# Patient Record
Sex: Female | Born: 1990 | Race: Black or African American | Hispanic: No | Marital: Single | Smoking: Former smoker
Health system: Southern US, Community
[De-identification: ages and names within clinical notes are randomized; demographics above are authoritative.]

## PROBLEM LIST (undated history)

## (undated) DIAGNOSIS — J45909 Unspecified asthma, uncomplicated: Secondary | ICD-10-CM

## (undated) DIAGNOSIS — F53 Postpartum depression: Secondary | ICD-10-CM

## (undated) DIAGNOSIS — O99345 Other mental disorders complicating the puerperium: Secondary | ICD-10-CM

## (undated) HISTORY — DX: Postpartum depression: F53.0

## (undated) HISTORY — PX: APPENDECTOMY: SHX54

## (undated) HISTORY — DX: Other mental disorders complicating the puerperium: O99.345

## (undated) HISTORY — PX: CHOLECYSTECTOMY: SHX55

---

## 2017-01-02 ENCOUNTER — Emergency Department (HOSPITAL_BASED_OUTPATIENT_CLINIC_OR_DEPARTMENT_OTHER)
Admission: EM | Admit: 2017-01-02 | Discharge: 2017-01-02 | Disposition: A | Discharge status: Home / Self Care | Payer: BLUE CROSS/BLUE SHIELD | Attending: Physician Assistant | Admitting: Physician Assistant

## 2017-01-02 ENCOUNTER — Encounter (HOSPITAL_BASED_OUTPATIENT_CLINIC_OR_DEPARTMENT_OTHER): Payer: Self-pay

## 2017-01-02 ENCOUNTER — Emergency Department (HOSPITAL_BASED_OUTPATIENT_CLINIC_OR_DEPARTMENT_OTHER): Payer: BLUE CROSS/BLUE SHIELD

## 2017-01-02 DIAGNOSIS — R079 Chest pain, unspecified: Secondary | ICD-10-CM | POA: Diagnosis present

## 2017-01-02 DIAGNOSIS — J45909 Unspecified asthma, uncomplicated: Secondary | ICD-10-CM | POA: Insufficient documentation

## 2017-01-02 DIAGNOSIS — B349 Viral infection, unspecified: Secondary | ICD-10-CM

## 2017-01-02 DIAGNOSIS — F17200 Nicotine dependence, unspecified, uncomplicated: Secondary | ICD-10-CM | POA: Insufficient documentation

## 2017-01-02 HISTORY — DX: Unspecified asthma, uncomplicated: J45.909

## 2017-01-02 LAB — URINALYSIS, MICROSCOPIC (REFLEX)

## 2017-01-02 LAB — COMPREHENSIVE METABOLIC PANEL
ALBUMIN: 3.9 g/dL (ref 3.5–5.0)
ALT: 12 U/L — ABNORMAL LOW (ref 14–54)
ANION GAP: 7 (ref 5–15)
AST: 15 U/L (ref 15–41)
Alkaline Phosphatase: 68 U/L (ref 38–126)
BILIRUBIN TOTAL: 0.3 mg/dL (ref 0.3–1.2)
BUN: 12 mg/dL (ref 6–20)
CHLORIDE: 105 mmol/L (ref 101–111)
CO2: 23 mmol/L (ref 22–32)
Calcium: 9 mg/dL (ref 8.9–10.3)
Creatinine, Ser: 0.66 mg/dL (ref 0.44–1.00)
GFR calc Af Amer: >60 mL/min (ref >60)
Glucose, Bld: 94 mg/dL (ref 65–99)
POTASSIUM: 4 mmol/L (ref 3.5–5.1)
Sodium: 135 mmol/L (ref 135–145)
Total Protein: 7.3 g/dL (ref 6.5–8.1)

## 2017-01-02 LAB — CBC WITH DIFFERENTIAL/PLATELET
BASOS ABS: 0 10*3/uL (ref 0.0–0.1)
Basophils Relative: 0 %
Eosinophils Absolute: 0.2 10*3/uL (ref 0.0–0.7)
Eosinophils Relative: 2 %
HEMATOCRIT: 35.3 % — AB (ref 36.0–46.0)
Hemoglobin: 11.8 g/dL — ABNORMAL LOW (ref 12.0–15.0)
LYMPHS ABS: 1.2 10*3/uL (ref 0.7–4.0)
LYMPHS PCT: 14 %
MCH: 26.6 pg (ref 26.0–34.0)
MCHC: 33.4 g/dL (ref 30.0–36.0)
MCV: 79.7 fL (ref 78.0–100.0)
Monocytes Absolute: 0.8 10*3/uL (ref 0.1–1.0)
Monocytes Relative: 9 %
NEUTROS ABS: 6.7 10*3/uL (ref 1.7–7.7)
Neutrophils Relative %: 75 %
Platelets: 194 10*3/uL (ref 150–400)
RBC: 4.43 MIL/uL (ref 3.87–5.11)
RDW: 15.4 % (ref 11.5–15.5)
WBC: 8.8 10*3/uL (ref 4.0–10.5)

## 2017-01-02 LAB — URINALYSIS, ROUTINE W REFLEX MICROSCOPIC
Bilirubin Urine: NEGATIVE
GLUCOSE, UA: NEGATIVE mg/dL
HGB URINE DIPSTICK: NEGATIVE
Ketones, ur: NEGATIVE mg/dL
Nitrite: NEGATIVE
PROTEIN: NEGATIVE mg/dL
SPECIFIC GRAVITY, URINE: 1.025 (ref 1.005–1.030)
pH: 5.5 (ref 5.0–8.0)

## 2017-01-02 LAB — PREGNANCY, URINE: Preg Test, Ur: NEGATIVE

## 2017-01-02 LAB — TROPONIN I: Troponin I: <0.03 ng/mL (ref <0.03)

## 2017-01-02 MED ORDER — MONTELUKAST SODIUM 10 MG PO TABS: 10.0000 mg | ORAL_TABLET | Freq: Every day | ORAL | 0 refills | Status: AC

## 2017-01-02 MED ORDER — SODIUM CHLORIDE 0.9 % IV BOLUS (SEPSIS)
1000.0000 mL | Freq: Once | INTRAVENOUS | Status: AC
  Administered 2017-01-02: 1000 mL via INTRAVENOUS

## 2017-01-02 MED ORDER — FLUTICASONE PROPIONATE 50 MCG/ACT NA SUSP: 2.0000 | Freq: Every day | NASAL | 0 refills | Status: AC
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
## 2017-01-02 NOTE — ED Triage Notes (Signed)
c/o CP x 1 week-NAD-steady gait-NAD

## 2017-01-24 ENCOUNTER — Telehealth: Payer: Self-pay | Admitting: Behavioral Health

## 2017-01-24 NOTE — Telephone Encounter (Signed)
Attempted to reach patient for Pre-Visit Call. Unable to leave voicemail; per recording, the mailbox is full.

## 2017-01-27 ENCOUNTER — Other Ambulatory Visit (HOSPITAL_COMMUNITY)
Admission: RE | Admit: 2017-01-27 | Discharge: 2017-01-27 | Disposition: A | Discharge status: Home / Self Care | Payer: BLUE CROSS/BLUE SHIELD | Source: Ambulatory Visit | Attending: Family | Admitting: Family

## 2017-01-27 ENCOUNTER — Ambulatory Visit (INDEPENDENT_AMBULATORY_CARE_PROVIDER_SITE_OTHER): Payer: BLUE CROSS/BLUE SHIELD | Admitting: Family

## 2017-01-27 ENCOUNTER — Encounter: Payer: Self-pay | Admitting: Family

## 2017-01-27 VITALS — BP 103/62 | HR 70 | Temp 98.3°F | Resp 16 | Ht 68.0 in | Wt 203.2 lb

## 2017-01-27 DIAGNOSIS — N760 Acute vaginitis: Secondary | ICD-10-CM | POA: Diagnosis not present

## 2017-01-27 DIAGNOSIS — B373 Candidiasis of vulva and vagina: Secondary | ICD-10-CM | POA: Diagnosis not present

## 2017-01-27 DIAGNOSIS — R102 Pelvic and perineal pain unspecified side: Secondary | ICD-10-CM

## 2017-01-27 DIAGNOSIS — J45909 Unspecified asthma, uncomplicated: Secondary | ICD-10-CM

## 2017-01-27 DIAGNOSIS — J069 Acute upper respiratory infection, unspecified: Secondary | ICD-10-CM | POA: Diagnosis not present

## 2017-01-27 DIAGNOSIS — Z23 Encounter for immunization: Secondary | ICD-10-CM

## 2017-01-27 DIAGNOSIS — B9689 Other specified bacterial agents as the cause of diseases classified elsewhere: Secondary | ICD-10-CM | POA: Diagnosis not present

## 2017-01-27 DIAGNOSIS — N898 Other specified noninflammatory disorders of vagina: Secondary | ICD-10-CM

## 2017-01-27 MED ORDER — ALBUTEROL SULFATE HFA 108 (90 BASE) MCG/ACT IN AERS: 2.0000 | INHALATION_SPRAY | Freq: Four times a day (QID) | RESPIRATORY_TRACT | 0 refills | Status: AC | PRN

## 2017-01-27 NOTE — Addendum Note (Signed)
Addended by: Mervin KungFERGERSON, Petro Talent A on: 01/27/2017 04:12 PM   Modules accepted: Orders

## 2017-01-27 NOTE — Patient Instructions (Addendum)
We will contact you with your results and further recommendations. For your congestion you may use mucinex as needed. For asthma symptoms you may use albuterol as needed. You will be contacted about scheduling your pelvic ultrasound.  Call if cold symptoms worsen or if they fail to improve or if pelvic pain worsens.  Welcome to [facility]!

## 2017-01-27 NOTE — Progress Notes (Signed)
Subjective:    Patient ID: Meghan Harvey, female    DOB: Mar 30, 1991, 26 y.o.   MRN: [id number]  HPI  Meghan Harvey is a 26 yr old female who presents today to establish care.  She has two concerns:  1) "chest cold"- symptom have been present x 2 days. Reports + hx of asthma.She reports that she has had asthma since she was a child.  Uses her son's inhaler prn.  Notes thick sputum (green/yellow x 2 days).  She has an rx for singulair and flonase.  Has not started.    2) Pelvic pain- intermittent. Has been present x 2 months.  Has mild L lower pelvic pain.  A few days prior she had right sided pain.  Notes + vaginal discharge.  Discharge is thick/white.  Reports a new sexual partner but used condom.    Of note, she was recently seen in the ED on 5/10 and was diagnosed with viral illness. (ER record is reviewed). She was noted to be mildly anemic at that time (microcytic).  Urine HCG was negative. Denies dysuria/fever.  Did have one mucoid stool the other day.  Reports last pap 1-2 years.   Review of Systems  Constitutional: Negative for unexpected weight change.  HENT: Positive for rhinorrhea. Negative for hearing loss.   Eyes: Negative for visual disturbance.  Respiratory: Positive for cough.   Cardiovascular: Negative for leg swelling.  Gastrointestinal: Negative for constipation and diarrhea.  Genitourinary: Positive for pelvic pain and vaginal discharge. Negative for dysuria and menstrual problem.       Dyspareunia- for years   Musculoskeletal: Negative for arthralgias and myalgias.  Skin: Negative for rash.  Neurological:       Occasional headaches  Hematological: Negative for adenopathy.  Psychiatric/Behavioral:       Notes low energy but feels like this is related to working too much   Past Medical History:  Diagnosis Date  . Asthma      Social History   Social History  . Marital status: Single    Spouse name: N/A  . Number of children: N/A  . Years of education: N/A    Occupational History  . Not on file.   Social History Main Topics  . Smoking status: Former Smoker    Quit date: 03/26/2016  . Smokeless tobacco: Never Used  . Alcohol use Yes     Comment: wine coolers socially  . Drug use: No  . Sexual activity: Yes    Birth control/ protection: None   Other Topics Concern  . Not on file   Social History Narrative  . No narrative on file    Past Surgical History:  Procedure Laterality Date  . APPENDECTOMY    . CHOLECYSTECTOMY      Family History  Problem Relation Age of Onset  . Hypertension Mother   . Heart failure Mother   . Diabetes Mother   . Cervical cancer Mother   . Sickle cell trait Father   . Cancer Maternal Grandmother        breast  . Alzheimer's disease Paternal Grandmother   . Sickle cell trait Other     Allergies  Allergen Reactions  . Penicillins   . Shellfish Allergy     Current Outpatient Prescriptions on File Prior to Visit  Medication Sig Dispense Refill  . fluticasone (FLONASE) 50 MCG/ACT nasal spray Place 2 sprays into both nostrils daily. (Patient not taking: Reported on 01/27/2017) 9.9 g 0  . montelukast (SINGULAIR) 10  MG tablet Take 1 tablet (10 mg total) by mouth at bedtime. (Patient not taking: Reported on 01/27/2017) 30 tablet 0   No current facility-administered medications on file prior to visit.     BP 103/62 (BP Location: Right Arm, Cuff Size: Large)   Pulse 70   Temp 98.3 F (36.8 C) (Oral)   Resp 16   Ht 5\' 8"  (1.727 m)   Wt 203 lb 3.2 oz (92.2 kg)   LMP 01/04/2017   SpO2 100%   BMI 30.90 kg/m       Objective:   Physical Exam  Constitutional: She appears well-developed and well-nourished.  HENT:  Mouth/Throat: No oropharyngeal exudate, posterior oropharyngeal edema or posterior oropharyngeal erythema.  Bilateral cerumen noted, occluding TM  Neck: Neck supple. No thyromegaly present.  Cardiovascular: Normal rate, regular rhythm and normal heart sounds.   No murmur  heard. Pulmonary/Chest: Effort normal and breath sounds normal. No respiratory distress. She has no wheezes.  Abdominal: Soft.  Genitourinary: There is no rash on the right labia. There is no rash on the left labia. No vaginal discharge found.  Genitourinary Comments: Normal bimanual exam, except tenderness of left adnexa  Musculoskeletal: She exhibits no edema.  Lymphadenopathy:    She has no cervical adenopathy.  Neurological: She is alert.  Skin: Skin is warm and dry.  Psychiatric: She has a normal mood and affect. Her behavior is normal. Judgment and thought content normal.          Assessment & Plan:  Left adnexal pain- will obtain pelvic US to further evaluate.  Vaginal discharge- swab obtained today- will be sent for GC/Chlamydia, yeast, bacteria and trichomonas. Further rx pending result review  Viral uri- advised mucinex prn.  Call if symptoms worsen or if not resolved in 1 week.  Asthma- symptoms worse due to URI. Rx sent for prn albuterol.  She plans to start the singulair and flonase which is at her pharmacy.   Tdap today.

## 2017-01-28 LAB — CERVICOVAGINAL ANCILLARY ONLY
BACTERIAL VAGINITIS: POSITIVE — AB
CHLAMYDIA, DNA PROBE: NEGATIVE
Candida vaginitis: POSITIVE — AB
NEISSERIA GONORRHEA: NEGATIVE
TRICH (WINDOWPATH): NEGATIVE

## 2017-01-29 ENCOUNTER — Ambulatory Visit (HOSPITAL_BASED_OUTPATIENT_CLINIC_OR_DEPARTMENT_OTHER)
Admission: RE | Admit: 2017-01-29 | Discharge: 2017-01-29 | Disposition: A | Discharge status: Home / Self Care | Payer: BLUE CROSS/BLUE SHIELD | Source: Ambulatory Visit | Attending: Family | Admitting: Family

## 2017-01-29 ENCOUNTER — Ambulatory Visit (HOSPITAL_BASED_OUTPATIENT_CLINIC_OR_DEPARTMENT_OTHER): Payer: BLUE CROSS/BLUE SHIELD

## 2017-01-29 DIAGNOSIS — R102 Pelvic and perineal pain: Secondary | ICD-10-CM

## 2017-01-30 ENCOUNTER — Encounter: Payer: Self-pay | Admitting: Family

## 2017-02-01 ENCOUNTER — Telehealth: Payer: Self-pay | Admitting: Family

## 2017-02-01 MED ORDER — FLUCONAZOLE 150 MG PO TABS: ORAL_TABLET | ORAL | 0 refills | Status: AC

## 2017-02-01 MED ORDER — METRONIDAZOLE 0.75 % VA GEL: 1.0000 | Freq: Every day | VAGINAL | 0 refills | Status: AC

## 2017-02-01 NOTE — Telephone Encounter (Signed)
Can you please confirm that pt got message on Monday? tks

## 2017-02-03 NOTE — Telephone Encounter (Signed)
Left pt a message to call back. 

## 2018-05-23 IMAGING — CR DG CHEST 2V
2 series · 2 of 2 positions shown · non-contrast
Comparison: None.

CLINICAL DATA: Shortness of breath, chest pain.

EXAM:
CHEST  2 VIEW

[w chest pa]
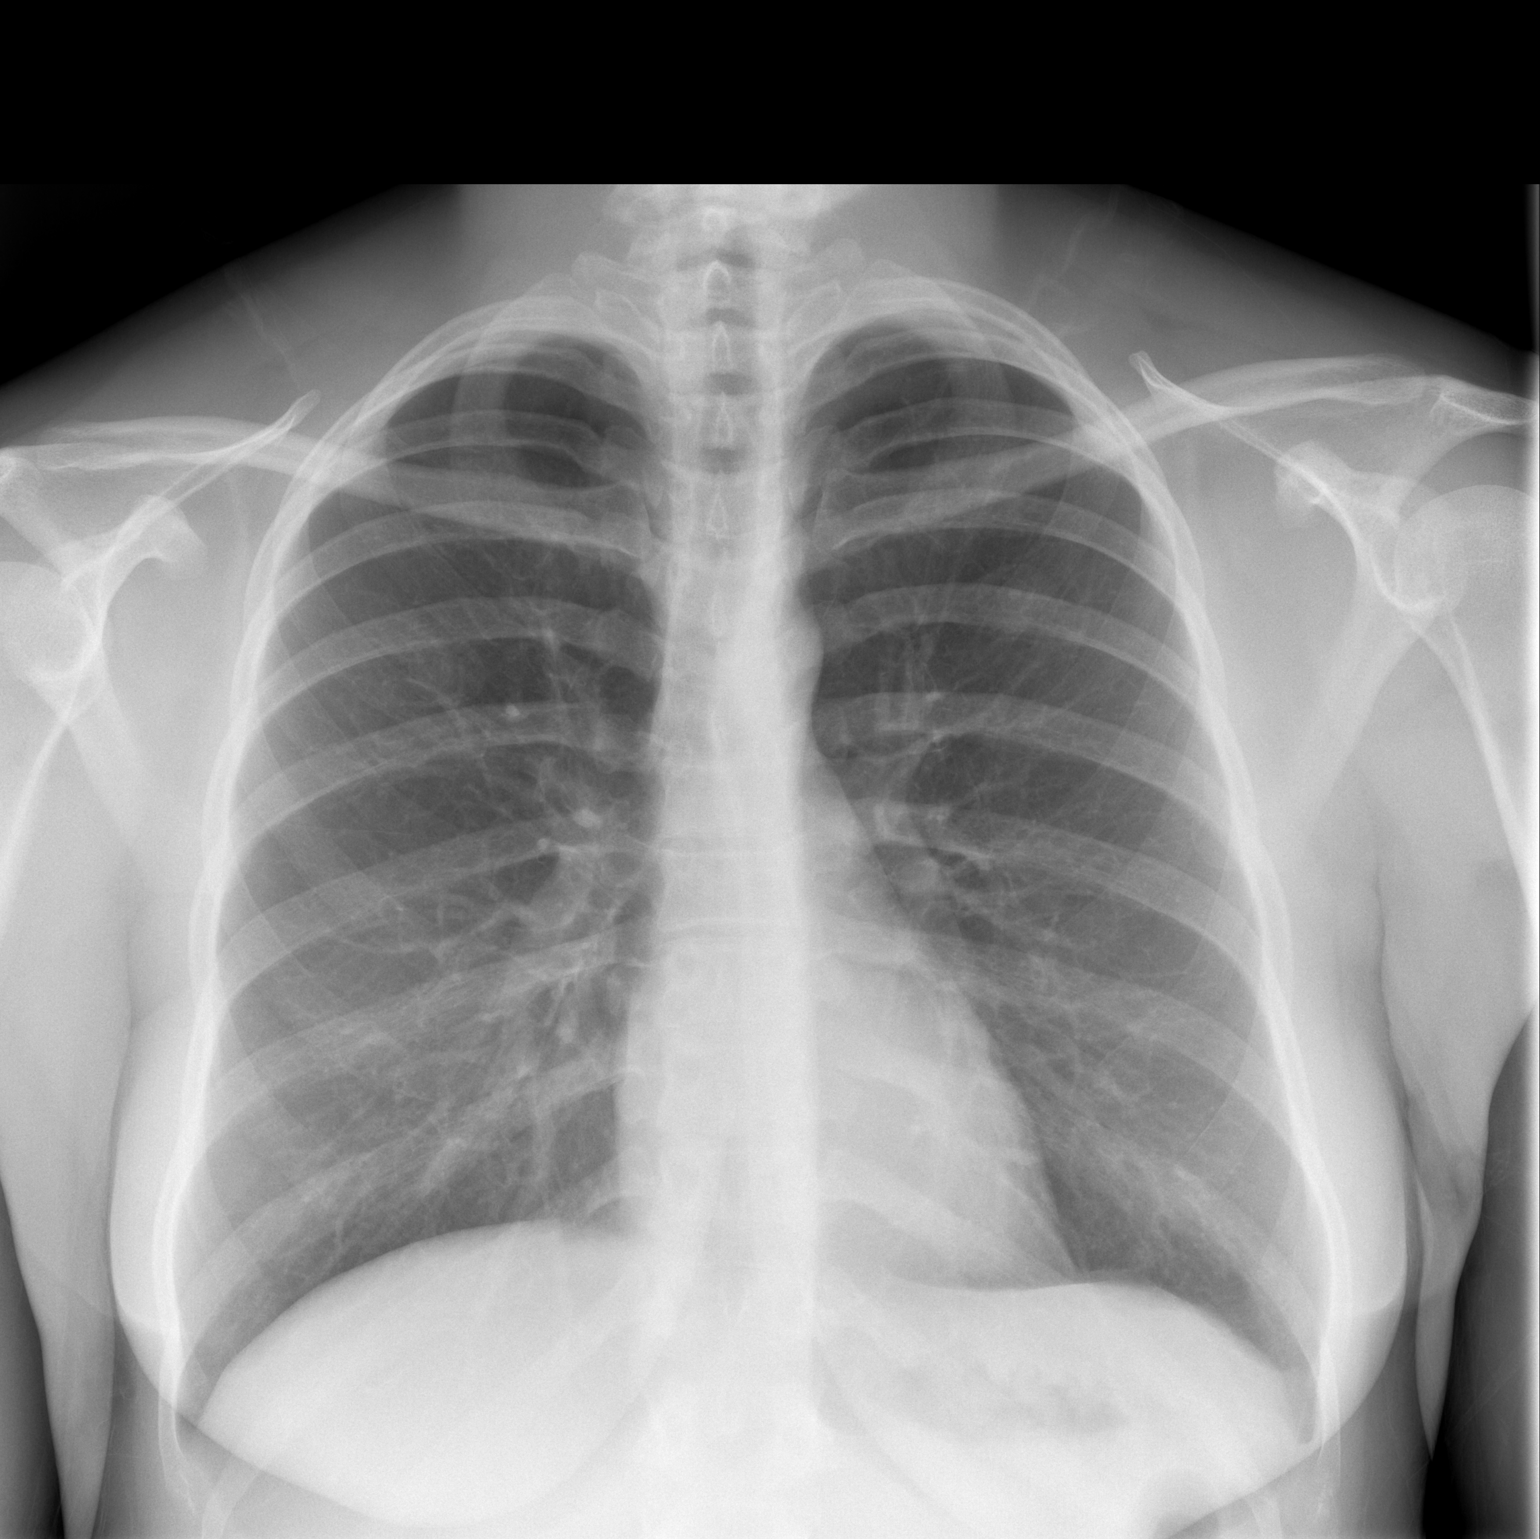

[w chest lat]
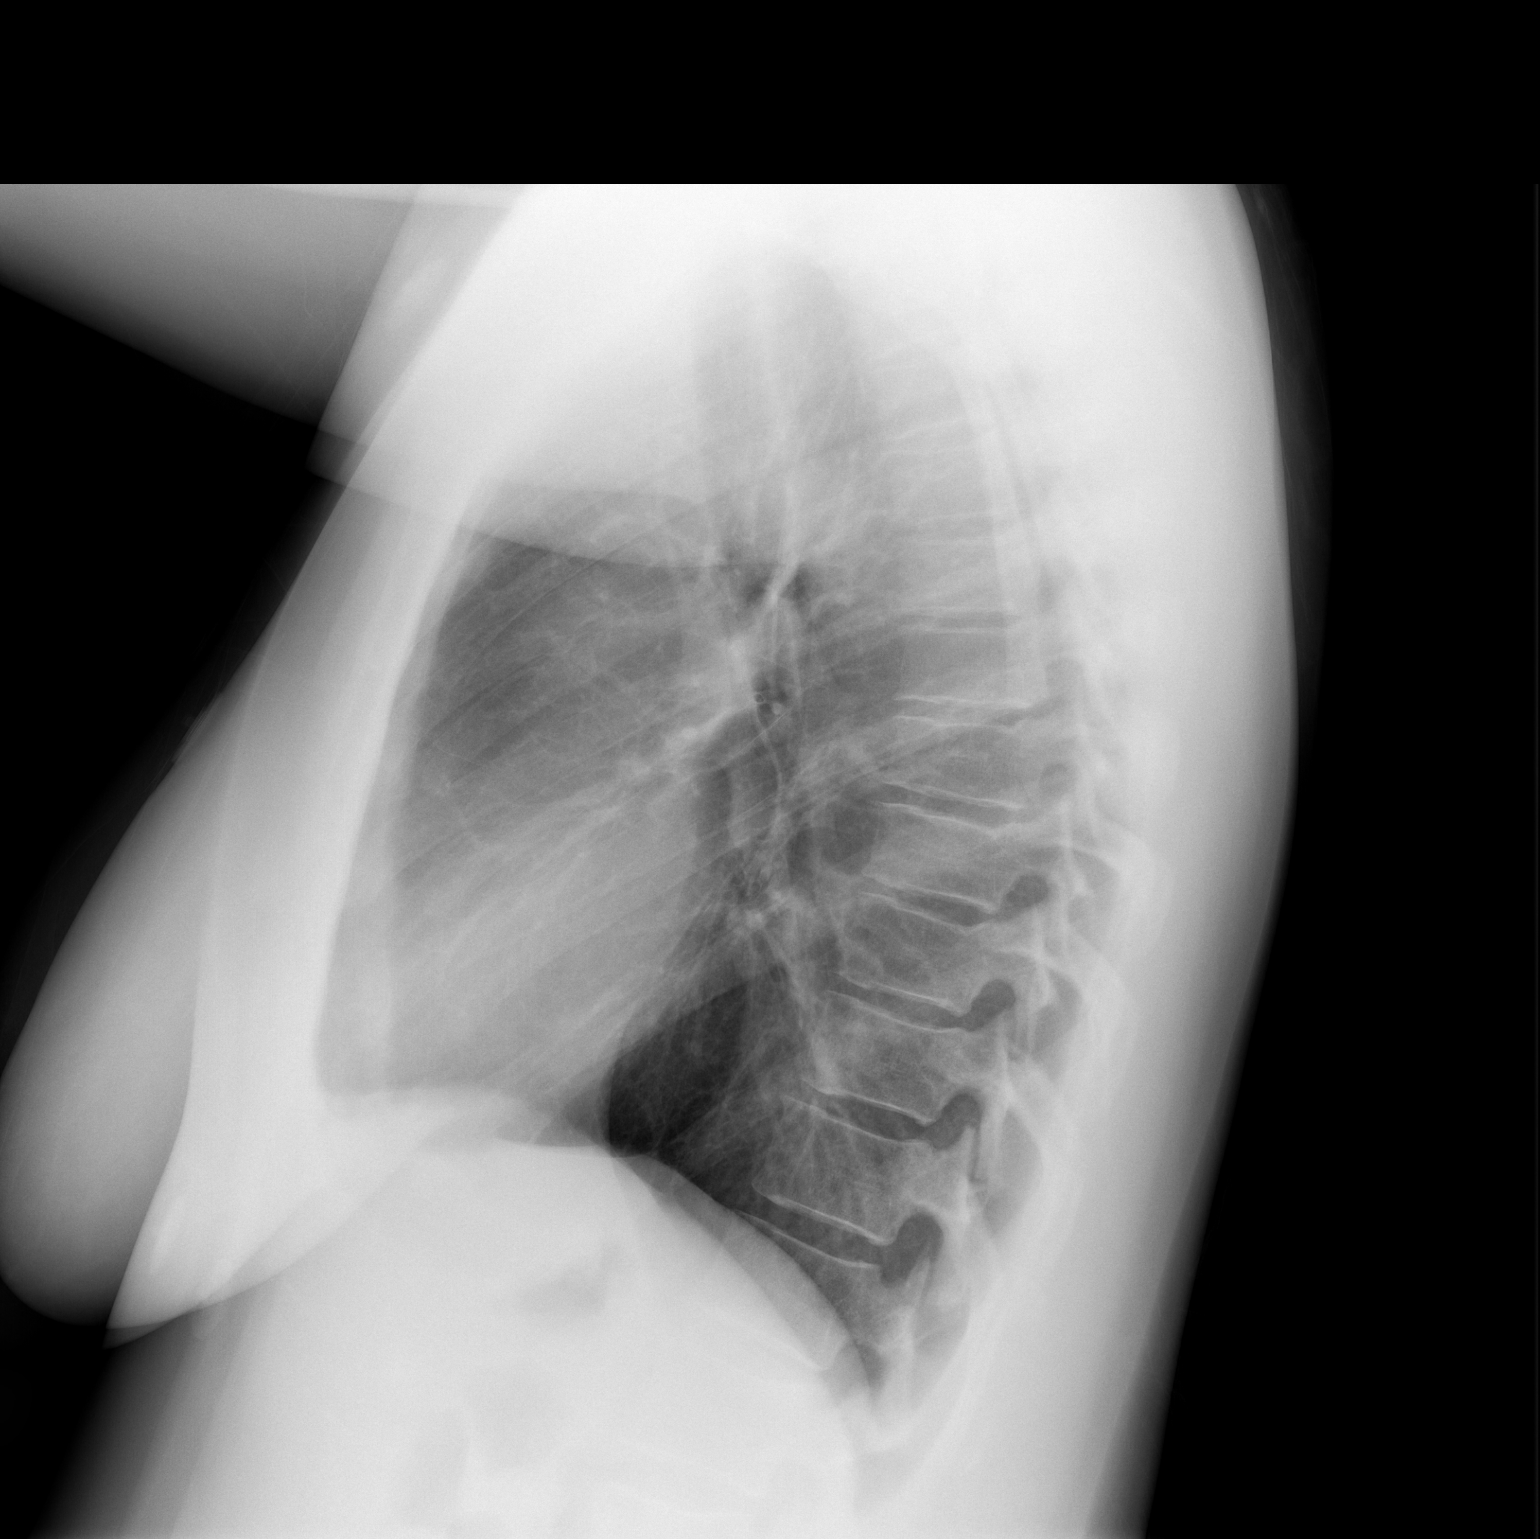

[2 of 2 positions shown; findings below may reference images not displayed]

FINDINGS: The heart size and mediastinal contours are within normal limits.
Both lungs are clear. No pneumothorax or pleural effusion is noted.
The visualized skeletal structures are unremarkable.
IMPRESSION: No active cardiopulmonary disease.

## 2018-11-19 IMAGING — US US TRANSVAGINAL NON-OB
1 series · 13 of 25 positions shown · non-contrast
Comparison: None

CLINICAL DATA: Initial evaluation for pelvic pain for several
months.

EXAM:
TRANSABDOMINAL AND TRANSVAGINAL ULTRASOUND OF PELVIS
TECHNIQUE: Both transabdominal and transvaginal ultrasound examinations of the
pelvis were performed. Transabdominal technique was performed for
global imaging of the pelvis including uterus, ovaries, adnexal
regions, and pelvic cul-de-sac. It was necessary to proceed with
endovaginal exam following the transabdominal exam to visualize the
uterus and ovaries.

[Series 1: us transvaginal non-ob · 0.21mm/px · 13 of 50 slices shown]
[im 1/50]
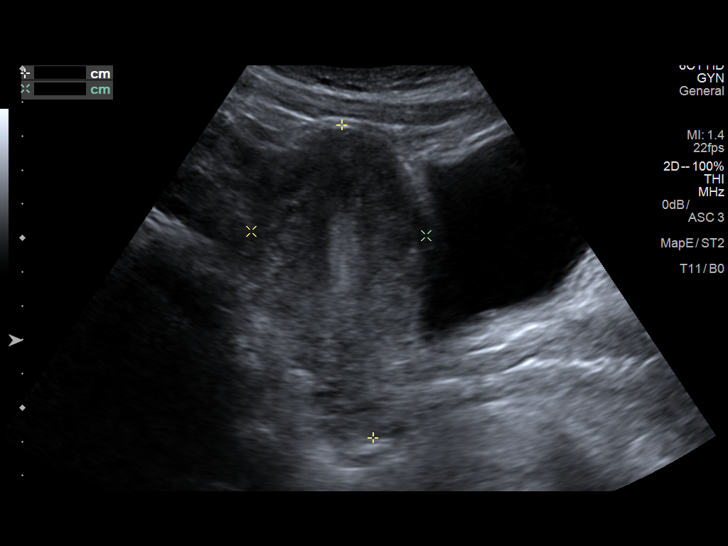
[im 5/50]
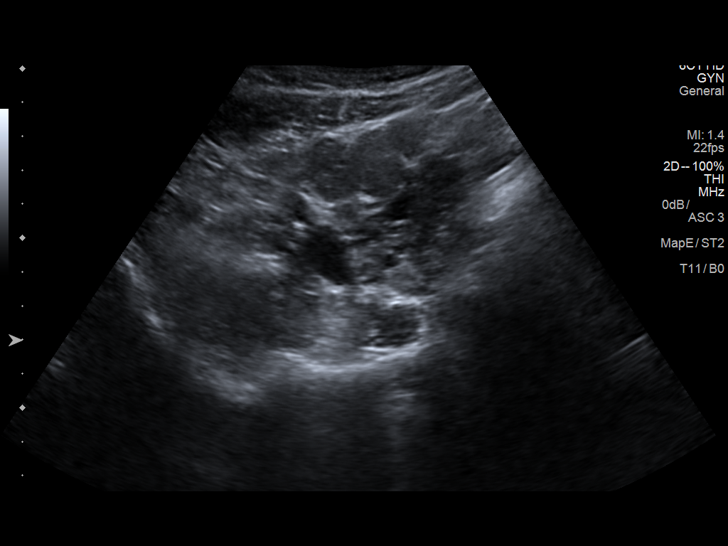
[im 9/50]
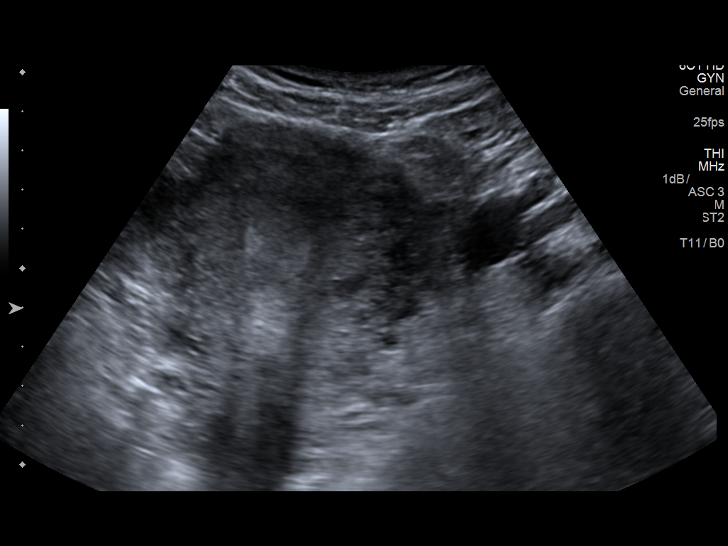
[im 13/50]
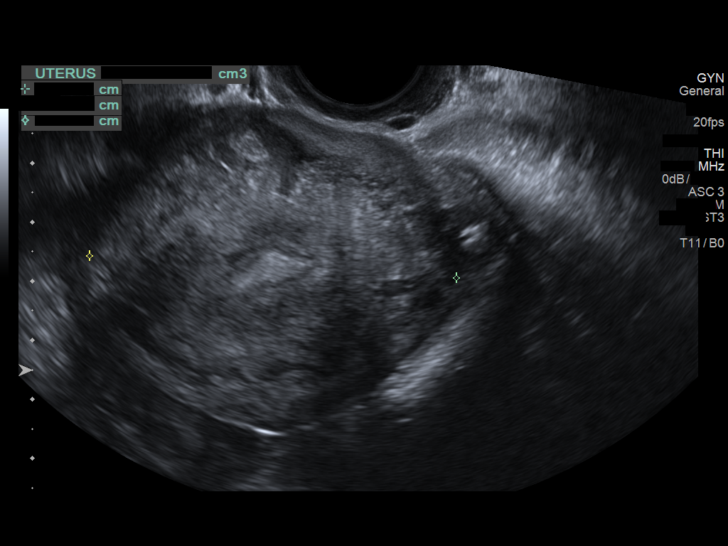
[im 17/50]
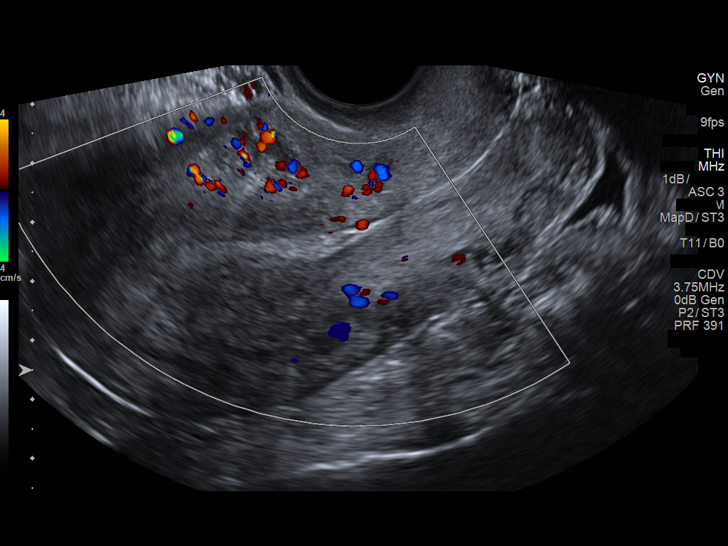
[im 21/50]
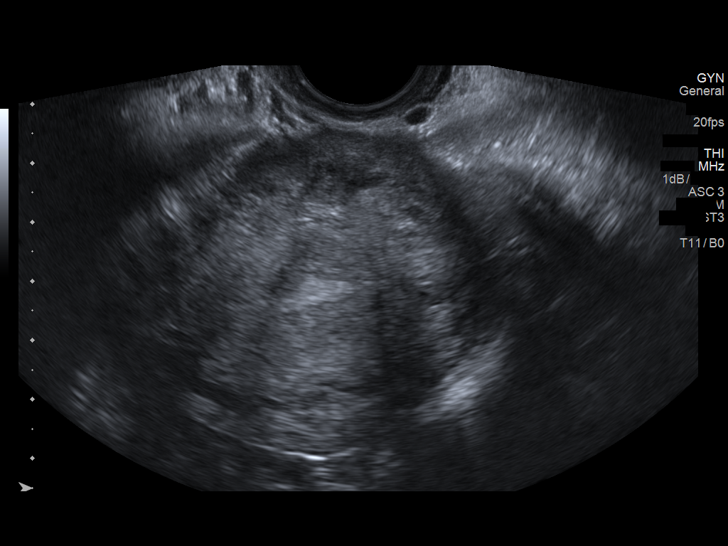
[im 25/50]
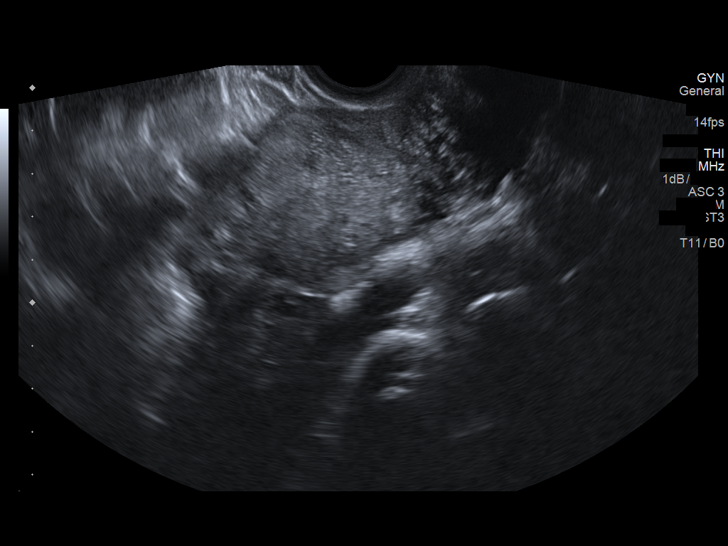
[im 29/50]
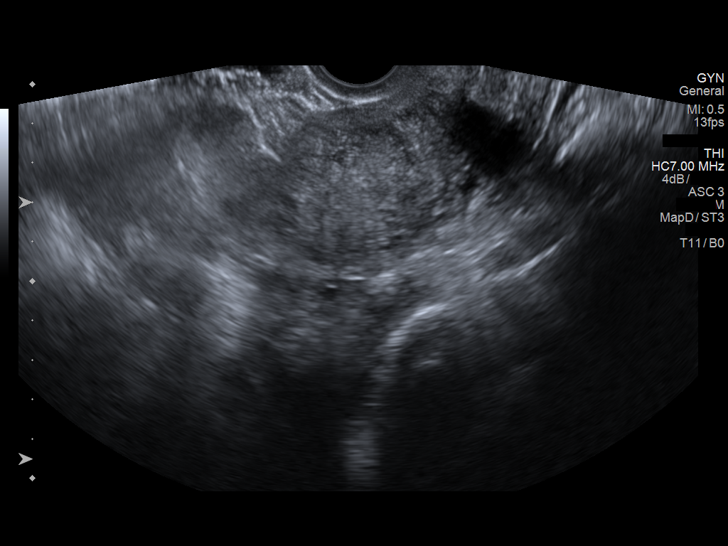
[im 33/50]
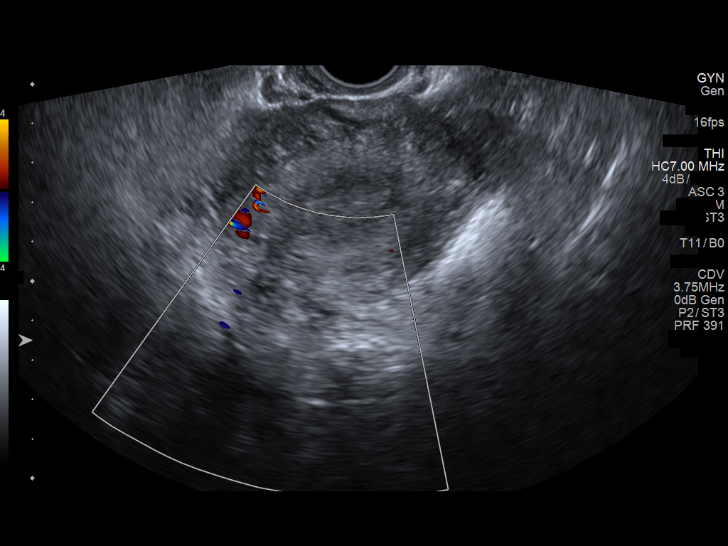
[im 37/50]
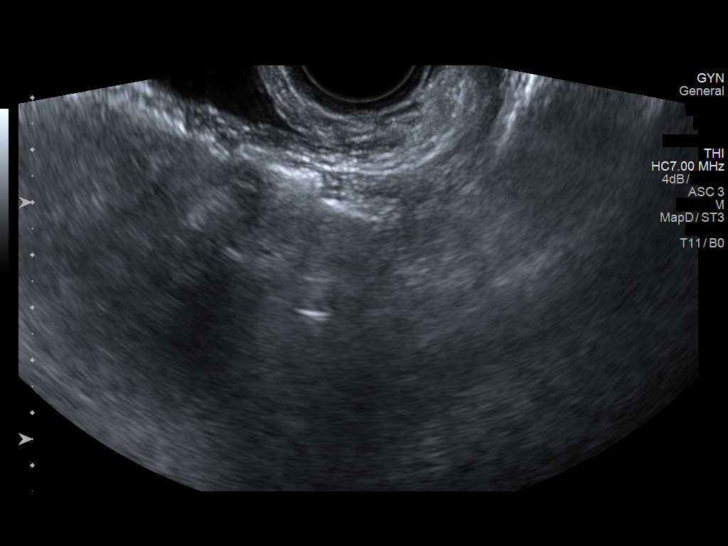
[im 41/50]
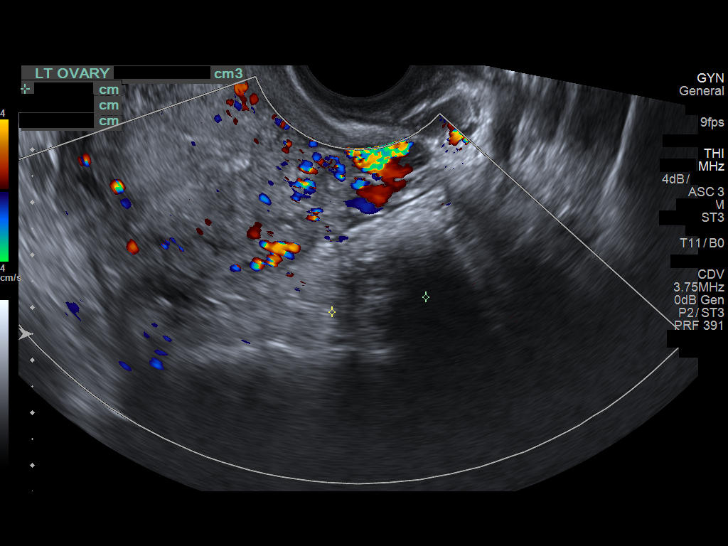
[im 45/50]
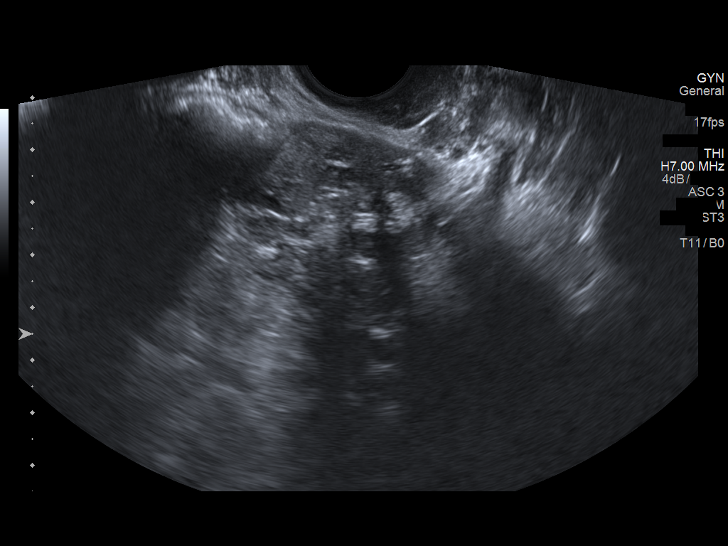
[im 50/50]
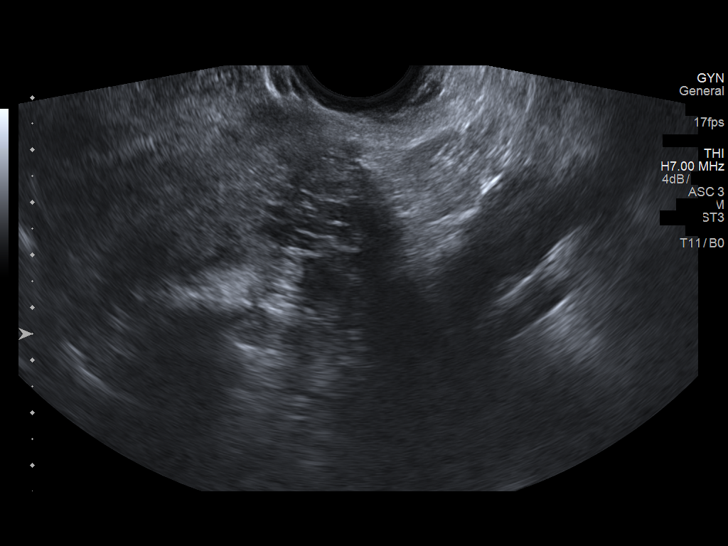

[13 of 25 positions shown; findings below may reference images not displayed]

FINDINGS: Uterus

Measurements: 9.8 x 4.3 x 6.2 cm. Uterus mildly heterogeneous
without focal fibroid or other mass lesion.

Endometrium

Thickness: 12 mm.  No focal abnormality visualized.

Right ovary

Measurements: 2.9 x 1.9 x 2.0 cm. Grossly normal appearance without
adnexal mass. Right ovary not well visualized on transvaginal
portion of this study due to incomplete filling of the urinary
bladder.

Left ovary

Measurements: 3.0 x 1.8 x 1.8 cm. Grossly normal appearance without
adnexal mass. Left ovary not well visualized on transvaginal portion
of this study due to incomplete filling of the urinary bladder.

Other findings

Trace free fluid, likely physiologic. There is apparent increased
vascularity within the left adnexa.
IMPRESSION: 1. Normal sonographic appearance of the uterus and ovaries. Please
note that evaluation of the ovaries was somewhat limited on this
exam due to incomplete filling of the urinary bladder.
2. Apparent increased vascularity within the left adnexa,
nonspecific, but could reflect the sequelae of underlying pelvic
congestion syndrome in the correct clinical setting.
